# Patient Record
Sex: Male | Born: 1955 | Race: White | Hispanic: No | Marital: Married | Smoking: Never smoker
Health system: Southern US, Community
[De-identification: ages and names within clinical notes are randomized; demographics above are authoritative.]

## PROBLEM LIST (undated history)

## (undated) ENCOUNTER — Emergency Department (HOSPITAL_COMMUNITY): Discharge status: Absent without leave | Payer: BC Managed Care – PPO

## (undated) DIAGNOSIS — F419 Anxiety disorder, unspecified: Secondary | ICD-10-CM

## (undated) HISTORY — PX: APPENDECTOMY: SHX54

## (undated) HISTORY — DX: Anxiety disorder, unspecified: F41.9

## (undated) HISTORY — PX: CHOLECYSTECTOMY: SHX55

---

## 2008-09-18 ENCOUNTER — Ambulatory Visit (HOSPITAL_COMMUNITY): Admission: RE | Admit: 2008-09-18 | Discharge: 2008-09-18 | Payer: Self-pay | Admitting: Family Medicine

## 2009-01-12 ENCOUNTER — Ambulatory Visit (HOSPITAL_COMMUNITY): Admission: RE | Admit: 2009-01-12 | Discharge: 2009-01-12 | Payer: Self-pay | Admitting: Family Medicine

## 2010-05-31 ENCOUNTER — Ambulatory Visit (HOSPITAL_COMMUNITY): Admission: RE | Admit: 2010-05-31 | Discharge: 2010-05-31 | Payer: Self-pay | Admitting: Family Medicine

## 2010-11-20 ENCOUNTER — Encounter: Payer: Self-pay | Admitting: Family Medicine

## 2011-07-10 ENCOUNTER — Ambulatory Visit: Payer: Self-pay | Admitting: Gastroenterology

## 2012-02-09 ENCOUNTER — Ambulatory Visit (HOSPITAL_COMMUNITY)
Admission: RE | Admit: 2012-02-09 | Discharge: 2012-02-09 | Disposition: A | Discharge status: Home / Self Care | Payer: BC Managed Care – PPO | Source: Ambulatory Visit | Attending: Family Medicine | Admitting: Family Medicine

## 2012-02-09 ENCOUNTER — Other Ambulatory Visit (HOSPITAL_COMMUNITY): Payer: Self-pay | Admitting: Family Medicine

## 2012-02-09 DIAGNOSIS — R0989 Other specified symptoms and signs involving the circulatory and respiratory systems: Secondary | ICD-10-CM | POA: Insufficient documentation

## 2012-02-09 DIAGNOSIS — R059 Cough, unspecified: Secondary | ICD-10-CM | POA: Insufficient documentation

## 2012-02-09 DIAGNOSIS — R05 Cough: Secondary | ICD-10-CM

## 2012-02-19 ENCOUNTER — Telehealth: Payer: Self-pay

## 2012-02-19 NOTE — Telephone Encounter (Signed)
Called home and work numbers. Both got many rings and no answer.

## 2012-03-12 NOTE — Telephone Encounter (Signed)
LMOM at home number to call. LM at work to call.

## 2012-03-22 NOTE — Telephone Encounter (Signed)
Called, many rings and no answer. Will mail letter for pt to call. Letter to PCP also.

## 2012-11-02 IMAGING — CR DG CHEST 2V
2 series · 2 of 2 positions shown · non-contrast
Comparison: 05/31/2010.

CLINICAL DATA: 55-year-old male with nonproductive cough.
Congestion.

CHEST - 2 VIEW

[view not recorded (1 of 2)]
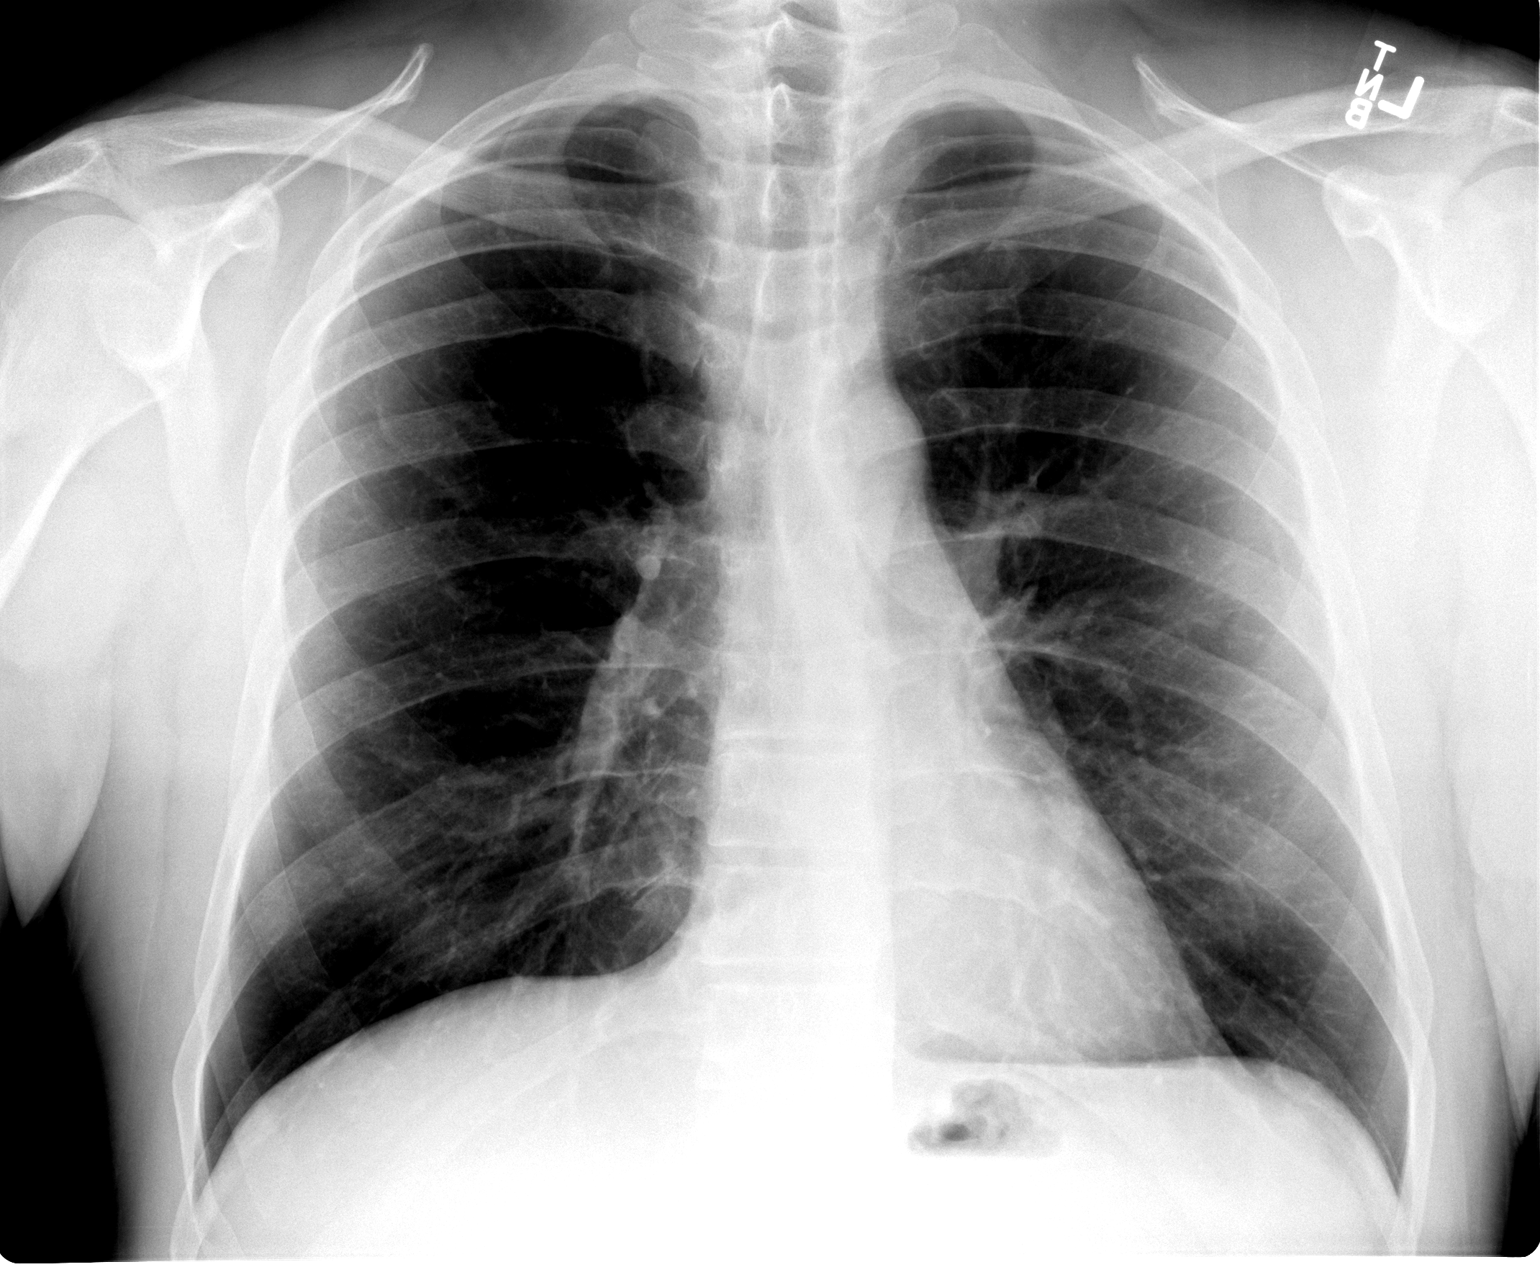

[view not recorded (2 of 2)]
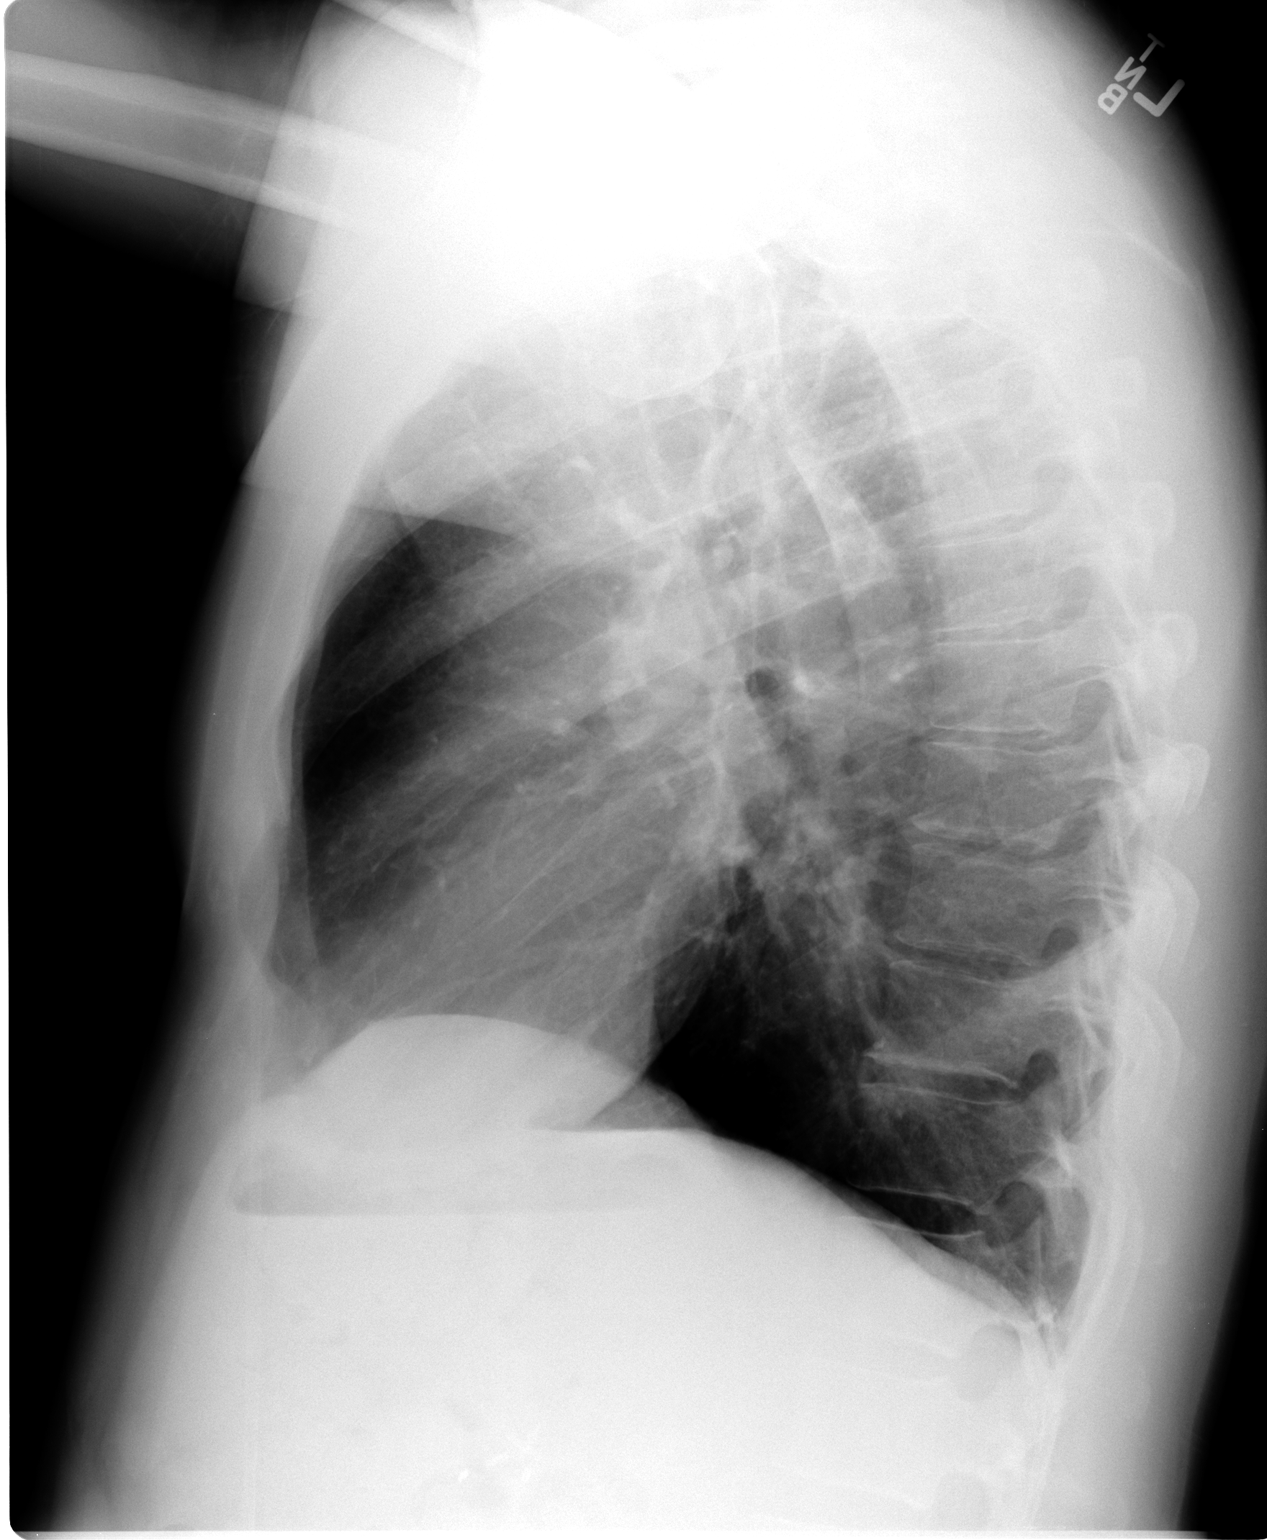

[2 of 2 positions shown; findings below may reference images not displayed]

FINDINGS: Normal cardiac size and mediastinal contours.  Visualized
tracheal air column is within normal limits.  Lung volumes are
within normal limits.  The lungs are clear.  No pneumothorax or
effusion.  Abdominal surgical clips. No acute osseous abnormality
identified.
IMPRESSION: Negative, no acute cardiopulmonary abnormality.

## 2016-07-24 ENCOUNTER — Ambulatory Visit (INDEPENDENT_AMBULATORY_CARE_PROVIDER_SITE_OTHER): Payer: BC Managed Care – PPO

## 2016-07-24 ENCOUNTER — Ambulatory Visit: Payer: BC Managed Care – PPO

## 2016-07-24 ENCOUNTER — Ambulatory Visit (INDEPENDENT_AMBULATORY_CARE_PROVIDER_SITE_OTHER): Payer: BC Managed Care – PPO | Admitting: Orthopedic Surgery

## 2016-07-24 VITALS — BP 116/82 | HR 78 | Ht 72.0 in | Wt 204.0 lb

## 2016-07-24 DIAGNOSIS — M25521 Pain in right elbow: Secondary | ICD-10-CM

## 2016-07-24 DIAGNOSIS — M7712 Lateral epicondylitis, left elbow: Secondary | ICD-10-CM | POA: Diagnosis not present

## 2016-07-24 DIAGNOSIS — M25522 Pain in left elbow: Secondary | ICD-10-CM

## 2016-07-24 NOTE — Patient Instructions (Signed)

## 2016-07-24 NOTE — Progress Notes (Signed)
Chief Complaint  Patient presents with  . Joint Swelling    Bilateral elbow pain, no injury.   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 He was treated for cough was elbow appropriately with ice, exercises, to anti-inflammatories. His medial pain in both elbows now mildly symptomatic but the lateral elbow pain has worsened and is causing him severe disability. He thinks this may have started after he stained is that  Review of Systems  Constitutional: Negative for chills, fever and weight loss.  Respiratory: Negative for shortness of breath.   Cardiovascular: Negative for chest pain.  Neurological: Negative for tingling.    No past medical history on file.  No past surgical history on file. No family history on file. Social History  Substance Use Topics  . Smoking status: Not on file  . Smokeless tobacco: Not on file  . Alcohol use Not on file   No outpatient prescriptions have been marked as taking for the 07/24/16 encounter (Office Visit) with Vickki HearingStanley E Bergen Melle, MD.    BP 116/82   Pulse 78   Ht 6' (1.829 m)   Wt 204 lb (92.5 kg)   BMI 27.67 kg/m   Physical Exam  Constitutional: He is oriented to person, place, and time. He appears well-developed and well-nourished. No distress.  Cardiovascular: Normal rate and intact distal pulses.   Neurological: He is alert and oriented to person, place, and time.  Skin: Skin is warm and dry. No rash noted. He is not diaphoretic. No erythema. No pallor.  Psychiatric: He has a normal mood and affect. His behavior is normal. Judgment and thought content normal.    Ortho Exam Right elbow medial tenderness mild full range of motion no instability normal strength negative wrist resistance test skin normal normal pulses and no epitrochlear lymph nodes are palpable  Left elbow mild medial tenderness more severe lateral  tenderness positive wrist extension test grip strength normal elbow stable to varus valgus stress skin normal normal pulses no lymphadenopathy in neck or epitrochlear region no sensory abnormalities  ASSESSMENT: My personal interpretation of the images:  X-rays both elbows normal  Encounter Diagnoses  Name Primary?  . Elbow pain, left Yes  . Elbow pain, right   . Tennis elbow, left        PLAN Inject left elbow for tennis elbow  Follow-up if no improvement I gave him an option of surgery versus injection.  Procedure note injection for left tennis elbow  Diagnosis left tennis elbow  Anesthesia ethyl chloride was used Alcohol use is clean the skin  After we obtained verbal consent and timeout a 25-gauge needle was used to inject 40 mg of Depo-Medrol and 3 cc of 1% lidocaine just distal to the insertion of the ECRB  There were no complications and a sterile bandage was applied.   Fuller CanadaStanley Vestal Markin, MD 07/24/2016 4:25 PM  .meds

## 2021-10-20 ENCOUNTER — Encounter: Payer: Self-pay | Admitting: *Deleted

## 2021-11-29 ENCOUNTER — Ambulatory Visit (INDEPENDENT_AMBULATORY_CARE_PROVIDER_SITE_OTHER): Payer: BC Managed Care – PPO | Admitting: Urology

## 2021-11-29 ENCOUNTER — Encounter: Payer: Self-pay | Admitting: Urology

## 2021-11-29 ENCOUNTER — Other Ambulatory Visit: Payer: Self-pay

## 2021-11-29 VITALS — BP 128/80 | HR 59 | Wt 205.0 lb

## 2021-11-29 DIAGNOSIS — R972 Elevated prostate specific antigen [PSA]: Secondary | ICD-10-CM

## 2021-11-29 DIAGNOSIS — N400 Enlarged prostate without lower urinary tract symptoms: Secondary | ICD-10-CM

## 2021-11-29 NOTE — Progress Notes (Addendum)
H&P  Chief Complaint: Elevated PSA  History of Present Illness: 66 year old male referred by [facility] for elevated PSA.  The only data I have is a PSA drawn on 05/31/2021 and was 7.9. PSA on 12/25/2019 was 4.6.  Patient says that he underwent an ultrasound and biopsy of the prostate in [place], [place] within the past 10 years.  He also had what sounds like a negative hematuria evaluation for microscopic hematuria.  IPSS 5  No past medical history on file.    Home Medications:  Allergies as of 11/29/2021   No Known Allergies      Medication List    as of November 29, 2021  8:06 AM   You have not been prescribed any medications.     Allergies: No Known Allergies  No family history on file.  Social History:  has no history on file for tobacco use, alcohol use, and drug use.  ROS: A complete review of systems was performed.  All systems are negative except for pertinent findings as noted.  Physical Exam:  Vital signs in last 24 hours: There were no vitals taken for this visit. Constitutional:  Alert and oriented, No acute distress Cardiovascular: Regular rate  Respiratory: Normal respiratory effort GI: Abdomen is soft, nontender, nondistended, no abdominal masses. No CVAT.  No inguinal hernias. Genitourinary: Normal male phallus, testes are descended bilaterally and non-tender and without masses, scrotum is normal in appearance without lesions or masses, perineum is normal on inspection.  Prostate is 70 g in size, symmetric, nonnodular, nontender. Lymphatic: No lymphadenopathy Neurologic: Grossly intact, no focal deficits Psychiatric: Normal mood and affect  I have reviewed prior pt notes  I have reviewed notes from referring/previous physicians  I have independently reviewed prior imaging  I have reviewed prior PSA results  I have requested old records from [place] reviewed   Impression/Assessment:  1.  BPH without  significant symptoms-prostate glandular size approximately 70 on estimated DRE  2.  Elevated PSA.  Questionable history of ultrasound and biopsy in [facility].  His PSA went from 4.6 in 21-7.9 in 22  Plan:  1.  I will see if we can get records from [facility].  2.  I will repeat his PSA today  3.  If negative biopsy previously and persistent elevation of PSA, I will proceed with MRI of the prostate

## 2021-11-29 NOTE — Progress Notes (Signed)
Urological Symptom Review  Patient is experiencing the following symptoms: Get up at night to urinate Trouble starting stream Have to strain to urinate   Review of Systems  Gastrointestinal (upper)  : Negative for upper GI symptoms  Gastrointestinal (lower) : Negative for lower GI symptoms  Constitutional : Negative for symptoms  Skin: Negative for skin symptoms  Eyes: Negative for eye symptoms  Ear/Nose/Throat : Negative for Ear/Nose/Throat symptoms  Hematologic/Lymphatic: Negative for Hematologic/Lymphatic symptoms  Cardiovascular : Negative for cardiovascular symptoms  Respiratory : Negative for respiratory symptoms  Endocrine: Negative for endocrine symptoms  Musculoskeletal: Negative for musculoskeletal symptoms  Neurological: Negative for neurological symptoms  Psychologic: Negative for psychiatric symptoms 

## 2021-11-30 LAB — PSA: Prostate Specific Ag, Serum: 5.2 ng/mL — ABNORMAL HIGH (ref 0.0–4.0)

## 2021-12-13 ENCOUNTER — Telehealth: Payer: Self-pay

## 2021-12-13 NOTE — Telephone Encounter (Signed)
-----   Message from Marcine Matar, MD sent at 12/13/2021  9:55 AM EST ----- Let patient know his PSA was 5.2.  Still working on records from prior biopsy. ----- Message ----- From: Ferdinand Lango, RN Sent: 12/08/2021   5:31 PM EST To: Marcine Matar, MD  I see records in media from 2/1 from ED.  Are those the records?  ----- Message ----- From: Marcine Matar, MD Sent: 12/02/2021  12:01 PM EST To: Ferdinand Lango, RN  Notify patient that PSA was 5.2 on Tuesday, lower than in the summer 2022.  We are still waiting for his other records from the ED in office to come in.  If he has not heard from [place] in about another 10 days asked him to call.  Otherwise, once I receive old records and review those, we will be back with him about planning follow-up. ----- Message ----- From: Ferdinand Lango, RN Sent: 11/30/2021   8:38 AM EST To: Marcine Matar, MD  Please review

## 2021-12-13 NOTE — Telephone Encounter (Signed)
I called patient, no answer mailbox full  Message sent to [facility] to check into records.

## 2021-12-13 NOTE — Telephone Encounter (Signed)
Patient called and notified. Voiced understanding.  

## 2022-11-23 ENCOUNTER — Encounter (INDEPENDENT_AMBULATORY_CARE_PROVIDER_SITE_OTHER): Payer: Self-pay | Admitting: *Deleted

## 2023-01-09 ENCOUNTER — Telehealth (INDEPENDENT_AMBULATORY_CARE_PROVIDER_SITE_OTHER): Payer: Self-pay | Admitting: Gastroenterology

## 2023-01-09 NOTE — Telephone Encounter (Signed)
Any room Thanks 

## 2023-01-09 NOTE — Telephone Encounter (Signed)
Who is your primary care physician: Dr.Fusco  Reasons for the colonoscopy: Screening  Have you had a colonoscopy before?  No  Do you have family history of colon cancer? No  Previous colonoscopy with polyps removed? No  Do you have a history colorectal cancer?   No  Are you diabetic? If yes, Type 1 or Type 2?    No  Do you have a prosthetic or mechanical heart valve? No  Do you have a pacemaker/defibrillator?   No  Have you had endocarditis/atrial fibrillation? No  Have you had joint replacement within the last 12 months?  No  Do you tend to be constipated or have to use laxatives? No  Do you have any history of drugs or alchohol?  No  Do you use supplemental oxygen?  No  Have you had a stroke or heart attack within the last 6 months?No  Do you take weight loss medication? No  Do you take any blood-thinning medications such as: (aspirin, warfarin, Plavix, Aggrenox)  No  If yes we need the name, milligram, dosage and who is prescribing doctor  Current Outpatient Medications on File Prior to Visit  Medication Sig Dispense Refill   diazepam (VALIUM) 10 MG tablet Take 10 mg by mouth 2 (two) times daily as needed.     No current facility-administered medications on file prior to visit.    No Known Allergies   Pharmacy: [facility]  Primary Insurance Name: [employer] number where you can be reached: [phone]

## 2023-01-11 MED ORDER — PEG 3350-KCL-NA BICARB-NACL 420 G PO SOLR: 4000.0000 mL | Freq: Once | ORAL | 0 refills | Status: AC

## 2023-01-11 NOTE — Addendum Note (Signed)
Addended by: Vicente Males on: 01/11/2023 11:39 AM   Modules accepted: Orders

## 2023-01-11 NOTE — Telephone Encounter (Signed)
Pt contacted and scheduled colonoscopy for 02/08/23. Instructions will be mailed to patient. Prep sent to pharmacy.

## 2023-01-15 ENCOUNTER — Encounter (INDEPENDENT_AMBULATORY_CARE_PROVIDER_SITE_OTHER): Payer: Self-pay | Admitting: *Deleted

## 2023-01-15 NOTE — Telephone Encounter (Signed)
Referral completed

## 2023-02-08 ENCOUNTER — Other Ambulatory Visit: Payer: Self-pay

## 2023-02-08 ENCOUNTER — Encounter (HOSPITAL_COMMUNITY): Payer: Self-pay | Admitting: Gastroenterology

## 2023-02-08 ENCOUNTER — Encounter (HOSPITAL_COMMUNITY)
Admission: RE | Disposition: A | Discharge status: Home / Self Care | Payer: Self-pay | Source: Ambulatory Visit | Attending: Gastroenterology

## 2023-02-08 ENCOUNTER — Encounter (INDEPENDENT_AMBULATORY_CARE_PROVIDER_SITE_OTHER): Payer: Self-pay | Admitting: *Deleted

## 2023-02-08 ENCOUNTER — Ambulatory Visit (HOSPITAL_COMMUNITY): Payer: BC Managed Care – PPO | Admitting: Anesthesiology

## 2023-02-08 ENCOUNTER — Ambulatory Visit (HOSPITAL_COMMUNITY)
Admission: RE | Admit: 2023-02-08 | Discharge: 2023-02-08 | Disposition: A | Discharge status: Home / Self Care | Payer: BC Managed Care – PPO | Source: Ambulatory Visit | Attending: Gastroenterology | Admitting: Gastroenterology

## 2023-02-08 DIAGNOSIS — Z1211 Encounter for screening for malignant neoplasm of colon: Secondary | ICD-10-CM | POA: Diagnosis not present

## 2023-02-08 DIAGNOSIS — F419 Anxiety disorder, unspecified: Secondary | ICD-10-CM | POA: Insufficient documentation

## 2023-02-08 DIAGNOSIS — K648 Other hemorrhoids: Secondary | ICD-10-CM

## 2023-02-08 DIAGNOSIS — D122 Benign neoplasm of ascending colon: Secondary | ICD-10-CM | POA: Diagnosis not present

## 2023-02-08 HISTORY — PX: COLONOSCOPY WITH PROPOFOL: SHX5780

## 2023-02-08 HISTORY — PX: POLYPECTOMY: SHX149

## 2023-02-08 LAB — HM COLONOSCOPY

## 2023-02-08 SURGERY — COLONOSCOPY WITH PROPOFOL
Anesthesia: General

## 2023-02-08 MED ORDER — LIDOCAINE 2% (20 MG/ML) 5 ML SYRINGE
INTRAMUSCULAR | Status: DC | PRN
  Administered 2023-02-08: 60 mg via INTRAVENOUS

## 2023-02-08 MED ORDER — LACTATED RINGERS IV SOLN: INTRAVENOUS | Status: DC

## 2023-02-08 MED ORDER — PROPOFOL 500 MG/50ML IV EMUL
INTRAVENOUS | Status: AC
  Filled 2023-02-08: qty 50

## 2023-02-08 MED ORDER — PROPOFOL 10 MG/ML IV BOLUS
INTRAVENOUS | Status: DC | PRN
  Administered 2023-02-08 (×11): 50 mg via INTRAVENOUS

## 2023-02-08 MED ORDER — LIDOCAINE HCL (PF) 2 % IJ SOLN
INTRAMUSCULAR | Status: AC
  Filled 2023-02-08: qty 5

## 2023-02-08 MED ORDER — STERILE WATER FOR IRRIGATION IR SOLN
Status: DC | PRN
  Administered 2023-02-08: 60 mL

## 2023-02-08 MED ORDER — PHENYLEPHRINE 80 MCG/ML (10ML) SYRINGE FOR IV PUSH (FOR BLOOD PRESSURE SUPPORT)
PREFILLED_SYRINGE | INTRAVENOUS | Status: AC
  Filled 2023-02-08: qty 10

## 2023-02-08 NOTE — Anesthesia Procedure Notes (Signed)
Procedure Name: MAC Date/Time: 02/08/2023 10:12 AM  Performed by: Adria Dill, CRNAPre-anesthesia Checklist: Patient identified, Emergency Drugs available, Suction available and Patient being monitored Patient Re-evaluated:Patient Re-evaluated prior to induction Oxygen Delivery Method: Nasal cannula Preoxygenation: Pre-oxygenation with 100% oxygen Induction Type: IV induction Placement Confirmation: positive ETCO2 and breath sounds checked- equal and bilateral Dental Injury: Teeth and Oropharynx as per pre-operative assessment

## 2023-02-08 NOTE — Transfer of Care (Signed)
Immediate Anesthesia Transfer of Care Note  Patient: Charles Moss  Procedure(s) Performed: COLONOSCOPY WITH PROPOFOL POLYPECTOMY INTESTINAL  Patient Location: PACU  Anesthesia Type:MAC  Level of Consciousness: drowsy and patient cooperative  Airway & Oxygen Therapy: Patient Spontanous Breathing  Post-op Assessment: Report given to RN and Post -op Vital signs reviewed and stable  Post vital signs: Reviewed and stable  Last Vitals:  Vitals Value Taken Time  BP 94/61 02/08/23 1034  Temp 36.4 C 02/08/23 1034  Pulse 68 02/08/23 1034  Resp 17 02/08/23 1034  SpO2 96 % 02/08/23 1034    Last Pain:  Vitals:   02/08/23 1034  TempSrc: Axillary  PainSc: 0-No pain      Patients Stated Pain Goal: 10 (02/08/23 0834)  Complications: No notable events documented.

## 2023-02-08 NOTE — Discharge Instructions (Signed)
You are being discharged to home.  Resume your previous diet.  We are waiting for your pathology results.  Your physician has recommended a repeat colonoscopy for surveillance based on pathology results.  

## 2023-02-08 NOTE — Op Note (Signed)
[facility] Patient Name: Charles Moss Procedure Date: 02/08/2023 9:59 AM MRN: [id number] Date of Birth: [birth year] Attending MD: Katrinka Blazing , , [id number] CSN: [id number] Age: 67 Admit Type: Outpatient Procedure:                Colonoscopy Indications:              Screening for colorectal malignant neoplasm Providers:                Katrinka Blazing, Crystal Page, Kristine L. Jessee Avers, Technician Referring MD:              Medicines:                Monitored Anesthesia Care Complications:            No immediate complications. Estimated Blood Loss:     Estimated blood loss: none. Procedure:                Pre-Anesthesia Assessment:                           - Prior to the procedure, a History and Physical                            was performed, and patient medications, allergies                            and sensitivities were reviewed. The patient's                            tolerance of previous anesthesia was reviewed.                           - The risks and benefits of the procedure and the                            sedation options and risks were discussed with the                            patient. All questions were answered and informed                            consent was obtained.                           - ASA Grade Assessment: II - A patient with mild                            systemic disease.                           After obtaining informed consent, the colonoscope                            was passed under direct vision. Throughout the  procedure, the patient's blood pressure, pulse, and                            oxygen saturations were monitored continuously. The                            PCF-HQ190L ([id number]) scope was introduced through                            the anus and advanced to the the cecum, identified                            by appendiceal orifice and ileocecal valve. The                             colonoscopy was performed without difficulty. The                            patient tolerated the procedure well. The quality                            of the bowel preparation was good. Scope In: 10:11:55 AM Scope Out: 10:31:29 AM Scope Withdrawal Time: 0 hours 13 minutes 17 seconds  Total Procedure Duration: 0 hours 19 minutes 34 seconds  Findings:      The perianal and digital rectal examinations were normal.      A 4 mm polyp was found in the ascending colon. The polyp was flat. The       polyp was removed with a cold snare. Resection and retrieval were       complete.      Non-bleeding internal hemorrhoids were found during retroflexion. The       hemorrhoids were small. Impression:               - One 4 mm polyp in the ascending colon, removed                            with a cold snare. Resected and retrieved.                           - Non-bleeding internal hemorrhoids. Moderate Sedation:      Per Anesthesia Care Recommendation:           - Discharge patient to home (ambulatory).                           - Resume previous diet.                           - Await pathology results.                           - Repeat colonoscopy for surveillance based on                            pathology results. Procedure Code(s):        ---  Professional ---                           [id number], Colonoscopy, flexible; with removal of                            tumor(s), polyp(s), or other lesion(s) by snare                            technique Diagnosis Code(s):        --- Professional ---                           Z12.11, Encounter for screening for malignant                            neoplasm of colon                           D12.2, Benign neoplasm of ascending colon                           K64.8, Other hemorrhoids CPT copyright 2022 American Medical Association. All rights reserved. The codes documented in this report are preliminary and upon coder review  may  be revised to meet current compliance requirements. Katrinka Blazinganiel Castaneda, MD Katrinka Blazinganiel Castaneda,  02/08/2023 10:35:10 AM This report has been signed electronically. Number of Addenda: 0

## 2023-02-08 NOTE — Anesthesia Preprocedure Evaluation (Signed)
Anesthesia Evaluation  Patient identified by MRN, date of birth, ID band Patient awake    Reviewed: Allergy & Precautions, H&P , NPO status , Patient's Chart, lab work & pertinent test results, reviewed documented beta blocker date and time   Airway Mallampati: II  TM Distance: >3 FB Neck ROM: full    Dental no notable dental hx.    Pulmonary neg pulmonary ROS   Pulmonary exam normal breath sounds clear to auscultation       Cardiovascular Exercise Tolerance: Good negative cardio ROS  Rhythm:regular Rate:Normal     Neuro/Psych   Anxiety     negative neurological ROS  negative psych ROS   GI/Hepatic negative GI ROS, Neg liver ROS,,,  Endo/Other  negative endocrine ROS    Renal/GU negative Renal ROS  negative genitourinary   Musculoskeletal   Abdominal   Peds  Hematology negative hematology ROS (+)   Anesthesia Other Findings   Reproductive/Obstetrics negative OB ROS                             Anesthesia Physical Anesthesia Plan  ASA: 1  Anesthesia Plan: General   Post-op Pain Management:    Induction:   PONV Risk Score and Plan: Propofol infusion  Airway Management Planned:   Additional Equipment:   Intra-op Plan:   Post-operative Plan:   Informed Consent: I have reviewed the patients History and Physical, chart, labs and discussed the procedure including the risks, benefits and alternatives for the proposed anesthesia with the patient or authorized representative who has indicated his/her understanding and acceptance.     Dental Advisory Given  Plan Discussed with: CRNA  Anesthesia Plan Comments:        Anesthesia Quick Evaluation

## 2023-02-08 NOTE — H&P (Signed)
Charles Moss is an 67 y.o. male.   Chief Complaint: Colorectal cancer screening HPI: 67 year old male with past medical history of anxiety, coming for screening colonoscopy. The patient has never had a colonoscopy in the past.  The patient denies having any complaints such as melena, hematochezia, abdominal pain or distention, change in her bowel movement consistency or frequency, no changes in weight recently.  No family history of colorectal cancer.   Past Medical History:  Diagnosis Date   Anxiety     Past Surgical History:  Procedure Laterality Date   APPENDECTOMY     CHOLECYSTECTOMY      History reviewed. No pertinent family history. Social History:  reports that he has never smoked. He has never used smokeless tobacco. He reports current alcohol use of about 1.0 standard drink of alcohol per week. He reports that he does not use drugs.  Allergies: No Known Allergies  Medications Prior to Admission  Medication Sig Dispense Refill   diazepam (VALIUM) 10 MG tablet Take 10 mg by mouth 2 (two) times daily as needed.      No results found for this or any previous visit (from the past 48 hour(s)). No results found.  Review of Systems  All other systems reviewed and are negative.   Blood pressure 132/82, pulse 68, temperature 97.9 F (36.6 C), temperature source Oral, resp. rate 14, height 6' (1.829 m), weight 95.3 kg, SpO2 96 %. Physical Exam  GENERAL: The patient is AO x3, in no acute distress. HEENT: Head is normocephalic and atraumatic. EOMI are intact. Mouth is well hydrated and without lesions. NECK: Supple. No masses LUNGS: Clear to auscultation. No presence of rhonchi/wheezing/rales. Adequate chest expansion HEART: RRR, normal s1 and s2. ABDOMEN: Soft, nontender, no guarding, no peritoneal signs, and nondistended. BS +. No masses. EXTREMITIES: Without any cyanosis, clubbing, rash, lesions or edema. NEUROLOGIC: AOx3, no focal motor deficit. SKIN: no jaundice, no  rashes  Assessment/Plan 67 year old male with past medical history of anxiety, coming for screening colonoscopy. The patient is at average risk for colorectal cancer.  We will proceed with colonoscopy today.   Dolores Frame, MD 02/08/2023, 8:51 AM

## 2023-02-09 LAB — SURGICAL PATHOLOGY

## 2023-02-09 NOTE — Anesthesia Postprocedure Evaluation (Signed)
Anesthesia Post Note  Patient: Charles Moss  Procedure(s) Performed: COLONOSCOPY WITH PROPOFOL POLYPECTOMY INTESTINAL  Patient location during evaluation: Phase II Anesthesia Type: General Level of consciousness: awake Pain management: pain level controlled Vital Signs Assessment: post-procedure vital signs reviewed and stable Respiratory status: spontaneous breathing and respiratory function stable Cardiovascular status: blood pressure returned to baseline and stable Postop Assessment: no headache and no apparent nausea or vomiting Anesthetic complications: no Comments: Late entry   No notable events documented.   Last Vitals:  Vitals:   02/08/23 0834 02/08/23 1034  BP: 132/82 94/61  Pulse: 68 68  Resp: 14 17  Temp: 36.6 C (!) 36.4 C  SpO2: 96% 96%    Last Pain:  Vitals:   02/08/23 1039  TempSrc:   PainSc: 0-No pain                 Windell Norfolk

## 2023-02-13 ENCOUNTER — Encounter (INDEPENDENT_AMBULATORY_CARE_PROVIDER_SITE_OTHER): Payer: Self-pay | Admitting: *Deleted

## 2023-02-15 ENCOUNTER — Encounter (HOSPITAL_COMMUNITY): Payer: Self-pay | Admitting: Gastroenterology

## 2023-06-25 NOTE — Progress Notes (Signed)
History of Present Illness: Charles Moss returns for further f/u of elevated PSA. Initial visit 1.31.2023 at which time his PSA in this office was 5.2. Earlier PSA levels--  2.25.2021--4.6 8.22.2022--7.9  Past Medical History:  Diagnosis Date   Anxiety     Past Surgical History:  Procedure Laterality Date   APPENDECTOMY     CHOLECYSTECTOMY     COLONOSCOPY WITH PROPOFOL N/A 02/08/2023   Procedure: COLONOSCOPY WITH PROPOFOL;  Surgeon: Dolores Frame, MD;  Location: [facility];  Service: Gastroenterology;  Laterality: N/A;  10:30am;asa 1-2   POLYPECTOMY  02/08/2023   Procedure: POLYPECTOMY INTESTINAL;  Surgeon: Dolores Frame, MD;  Location: [facility];  Service: Gastroenterology;;    Home Medications:  Allergies as of 06/26/2023   No Known Allergies      Medication List        Accurate as of June 25, 2023  2:45 PM. If you have any questions, ask your nurse or doctor.          diazepam 10 MG tablet Commonly known as: VALIUM Take 10 mg by mouth 2 (two) times daily as needed.        Allergies: No Known Allergies  No family history on file.  Social History:  reports that he has never smoked. He has never used smokeless tobacco. He reports current alcohol use of about 1.0 standard drink of alcohol per week. He reports that he does not use drugs.  ROS: A complete review of systems was performed.  All systems are negative except for pertinent findings as noted.  Physical Exam:  Vital signs in last 24 hours: There were no vitals taken for this visit. Constitutional:  Alert and oriented, No acute distress Cardiovascular: Regular rate  Respiratory: Normal respiratory effort GI: Abdomen is soft, nontender, nondistended, no abdominal masses. No CVAT.  Genitourinary: Normal male phallus, testes are descended bilaterally and non-tender and without masses, scrotum is normal in appearance without lesions or masses, perineum is normal on  inspection. Lymphatic: No lymphadenopathy Neurologic: Grossly intact, no focal deficits Psychiatric: Normal mood and affect  I have reviewed prior pt notes  I have reviewed notes from referring/previous physicians  I have reviewed urinalysis results  I have independently reviewed prior imaging  I have reviewed prior PSA results  I have reviewed prior urine culture   Impression/Assessment:  ***  Plan:  ***

## 2023-06-26 ENCOUNTER — Other Ambulatory Visit: Payer: Self-pay | Admitting: Urology

## 2023-06-26 ENCOUNTER — Encounter: Payer: Self-pay | Admitting: Urology

## 2023-06-26 ENCOUNTER — Ambulatory Visit: Payer: BC Managed Care – PPO | Admitting: Urology

## 2023-06-26 ENCOUNTER — Telehealth: Payer: Self-pay | Admitting: Urology

## 2023-06-26 VITALS — BP 107/71 | HR 76

## 2023-06-26 DIAGNOSIS — R339 Retention of urine, unspecified: Secondary | ICD-10-CM

## 2023-06-26 DIAGNOSIS — R972 Elevated prostate specific antigen [PSA]: Secondary | ICD-10-CM

## 2023-06-26 DIAGNOSIS — N401 Enlarged prostate with lower urinary tract symptoms: Secondary | ICD-10-CM

## 2023-06-26 DIAGNOSIS — N400 Enlarged prostate without lower urinary tract symptoms: Secondary | ICD-10-CM

## 2023-06-26 MED ORDER — ALFUZOSIN HCL ER 10 MG PO TB24
10.0000 mg | ORAL_TABLET | Freq: Every day | ORAL | 11 refills | Status: AC
Start: 2023-06-26 — End: ?

## 2023-06-26 NOTE — Telephone Encounter (Signed)
Patient would like medication sent to [facility] in [place] please

## 2023-06-26 NOTE — Telephone Encounter (Signed)
Patient returned call to correct pharmacy for [facility] in [place]> Chart updated

## 2023-07-02 NOTE — Progress Notes (Signed)
History of Present Illness: Here for f/u of BPH w/ AUR as well as elevated PSA.  Seen last week for AUR--started on alfuzosin when seen here in the office.  He has tolerated the alfuzosin well.    Past Medical History:  Diagnosis Date   Anxiety     Past Surgical History:  Procedure Laterality Date   APPENDECTOMY     CHOLECYSTECTOMY     COLONOSCOPY WITH PROPOFOL N/A 02/08/2023   Procedure: COLONOSCOPY WITH PROPOFOL;  Surgeon: Dolores Frame, MD;  Location: [facility];  Service: Gastroenterology;  Laterality: N/A;  10:30am;asa 1-2   POLYPECTOMY  02/08/2023   Procedure: POLYPECTOMY INTESTINAL;  Surgeon: Dolores Frame, MD;  Location: [facility];  Service: Gastroenterology;;    Home Medications:  Allergies as of 07/03/2023   No Known Allergies      Medication List        Accurate as of July 02, 2023  4:01 PM. If you have any questions, ask your nurse or doctor.          alfuzosin 10 MG 24 hr tablet Commonly known as: UROXATRAL Take 1 tablet (10 mg total) by mouth daily with breakfast.   diazepam 10 MG tablet Commonly known as: VALIUM Take 10 mg by mouth 2 (two) times daily as needed.        Allergies: No Known Allergies  No family history on file.  Social History:  reports that he has never smoked. He has never used smokeless tobacco. He reports current alcohol use of about 1.0 standard drink of alcohol per week. He reports that he does not use drugs.  ROS: A complete review of systems was performed.  All systems are negative except for pertinent findings as noted.  Physical Exam:  Vital signs in last 24 hours: There were no vitals taken for this visit. Constitutional:  Alert and oriented, No acute distress Cardiovascular: Regular rate  Respiratory: Normal respiratory effort Neurologic: Grossly intact, no focal deficits Psychiatric: Normal mood and affect  I have reviewed prior pt notes  I have reviewed urinalysis  results  I have independently reviewed prior imaging  I have reviewed prior PSA results    Impression/Assessment:  1.  History of elevated PSA, no biopsy performed yet.  Most likely secondary to BPH  2.  BPH with retention, passed voiding trial today.  Plan:  1.  Continue alfuzosin.  2.  Cephalexin sent for coverage of his catheterization  3.  I will have him come back in a few weeks following PSA for recheck

## 2023-07-03 ENCOUNTER — Ambulatory Visit: Payer: Medicare PPO | Admitting: Urology

## 2023-07-03 ENCOUNTER — Encounter: Payer: Self-pay | Admitting: Urology

## 2023-07-03 VITALS — BP 112/73 | HR 83

## 2023-07-03 DIAGNOSIS — R339 Retention of urine, unspecified: Secondary | ICD-10-CM | POA: Diagnosis not present

## 2023-07-03 DIAGNOSIS — N401 Enlarged prostate with lower urinary tract symptoms: Secondary | ICD-10-CM | POA: Diagnosis not present

## 2023-07-03 DIAGNOSIS — R972 Elevated prostate specific antigen [PSA]: Secondary | ICD-10-CM

## 2023-07-03 DIAGNOSIS — N400 Enlarged prostate without lower urinary tract symptoms: Secondary | ICD-10-CM

## 2023-07-03 MED ORDER — CEPHALEXIN 500 MG PO CAPS
500.0000 mg | ORAL_CAPSULE | Freq: Two times a day (BID) | ORAL | 0 refills | Status: AC
Start: 2023-07-03 — End: 2023-07-06

## 2023-07-03 NOTE — Progress Notes (Signed)
Fill and Pull Catheter Removal  Patient is present today for a catheter removal.  Patient was cleaned and prepped in a sterile fashion of sterile water/ saline was instilled into the bladder when the patient felt the urge to urinate. 10ml of water was then drained from the balloon.  A 16FR foley cath was removed from the bladder no complications were noted .  Patient as then given some time to void on their own.  Patient can void  250 ml on their own after some time.  Patient tolerated well.  Performed by: Guss Bunde, CMA  Follow up/ Additional notes: MD to see after

## 2023-07-10 MED ORDER — ALFUZOSIN HCL ER 10 MG PO TB24
10.0000 mg | ORAL_TABLET | Freq: Every day | ORAL | 11 refills | Status: DC
Start: 2023-07-10 — End: 2024-07-02

## 2023-08-14 ENCOUNTER — Other Ambulatory Visit: Payer: Medicare PPO

## 2023-08-14 DIAGNOSIS — R972 Elevated prostate specific antigen [PSA]: Secondary | ICD-10-CM

## 2023-08-15 LAB — PSA: Prostate Specific Ag, Serum: 8.7 ng/mL — ABNORMAL HIGH (ref 0.0–4.0)

## 2023-08-20 ENCOUNTER — Ambulatory Visit: Payer: Medicare PPO | Admitting: Urology

## 2023-08-20 ENCOUNTER — Encounter: Payer: Self-pay | Admitting: Urology

## 2023-08-20 VITALS — BP 110/70 | HR 62

## 2023-08-20 DIAGNOSIS — N401 Enlarged prostate with lower urinary tract symptoms: Secondary | ICD-10-CM

## 2023-08-20 DIAGNOSIS — R339 Retention of urine, unspecified: Secondary | ICD-10-CM

## 2023-08-20 DIAGNOSIS — N400 Enlarged prostate without lower urinary tract symptoms: Secondary | ICD-10-CM

## 2023-08-20 DIAGNOSIS — R972 Elevated prostate specific antigen [PSA]: Secondary | ICD-10-CM | POA: Diagnosis not present

## 2023-08-20 LAB — BLADDER SCAN AMB NON-IMAGING: Scan Result: 32

## 2023-08-20 MED ORDER — LEVOFLOXACIN 750 MG PO TABS
750.0000 mg | ORAL_TABLET | Freq: Every day | ORAL | 0 refills | Status: DC
Start: 2023-08-20 — End: 2023-08-20

## 2023-08-20 MED ORDER — LEVOFLOXACIN 750 MG PO TABS
750.0000 mg | ORAL_TABLET | Freq: Every day | ORAL | 0 refills | Status: AC
Start: 2023-08-20 — End: 2023-08-21

## 2023-08-20 NOTE — Progress Notes (Signed)
History of Present Illness: Here for f/u of BPH w/ h/o retention.  He is doing well on alfuzosin. IPSS 10/2. No untoward SE's.  Recent PSA 8.7. Last value 5.2 in early '23.  Past Medical History:  Diagnosis Date   Anxiety     Past Surgical History:  Procedure Laterality Date   APPENDECTOMY     CHOLECYSTECTOMY     COLONOSCOPY WITH PROPOFOL N/A 02/08/2023   Procedure: COLONOSCOPY WITH PROPOFOL;  Surgeon: Dolores Frame, MD;  Location: [facility];  Service: Gastroenterology;  Laterality: N/A;  10:30am;asa 1-2   POLYPECTOMY  02/08/2023   Procedure: POLYPECTOMY INTESTINAL;  Surgeon: Dolores Frame, MD;  Location: [facility];  Service: Gastroenterology;;    Home Medications:  Allergies as of 08/20/2023   No Known Allergies      Medication List        Accurate as of August 20, 2023 10:50 AM. If you have any questions, ask your nurse or doctor.          alfuzosin 10 MG 24 hr tablet Commonly known as: UROXATRAL Take 1 tablet (10 mg total) by mouth daily with breakfast.   alfuzosin 10 MG 24 hr tablet Commonly known as: UROXATRAL Take 1 tablet (10 mg total) by mouth daily with breakfast.   diazepam 10 MG tablet Commonly known as: VALIUM Take 10 mg by mouth 2 (two) times daily as needed.        Allergies: No Known Allergies  No family history on file.  Social History:  reports that he has never smoked. He has never used smokeless tobacco. He reports current alcohol use of about 1.0 standard drink of alcohol per week. He reports that he does not use drugs.  ROS: A complete review of systems was performed.  All systems are negative except for pertinent findings as noted.  Physical Exam:  Vital signs in last 24 hours: There were no vitals taken for this visit. Constitutional:  Alert and oriented, No acute distress Cardiovascular: Regular rate  Neurologic: Grossly intact, no focal deficits Psychiatric: Normal mood and affect  I have  reviewed prior pt notes  I have reviewed urinalysis results  I have independently reviewed prior imaging  I have reviewed prior PSA results--recently 8.7  Bladder scan reviewed    Impression/Assessment:  BPH w/ LUTS--emptying well, limited sx's  2. Elevated PSA  Plan:  Recommend TRUS/Bx. Risks/complications discussed

## 2023-08-20 NOTE — Patient Instructions (Addendum)
   Appointment ZOXW:[id number] Appointment Date:10/16/2023  Location: [facility]   Prostate Biopsy Instructions  Stop all aspirin or blood thinners (aspirin, plavix, coumadin, warfarin, motrin, ibuprofen, advil, aleve, naproxen, naprosyn) for 7 days prior to the procedure.  If you have any questions about stopping these medications, please contact your primary care physician or cardiologist.  Having a light meal prior to the procedure is recommended.  If you are diabetic or have low blood sugar please bring a small snack or glucose tablet.  A Fleets enema is needed to be purchased over the counter at a local pharmacy and used 2 hours before you scheduled appointment.  This can be purchased over the counter at any pharmacy.  Antibiotics will be administered in the clinic at the time of the procedure and 1 tablet has been sent to your pharmacy. Please take the antibiotic as prescribed.    Please bring someone with you to the procedure to drive you home if you are given a valium to take prior to your procedure.   If you have any questions or concerns, please feel free to call the office at [phone] or send a Mychart message.    Thank you, [facility]

## 2023-08-20 NOTE — Addendum Note (Signed)
Addended by: Grier Rocher on: 08/20/2023 02:34 PM   Modules accepted: Orders

## 2023-10-15 NOTE — Progress Notes (Signed)
Charles Moss is here for TRUS/Bx for elevating PSA trend--most recently 8.7.  Risks, benefits, and some of the potential complications of a transrectal ultrasounds of the prostate (TRUSP) with biopsies were discussed at length with the patient including gross hematuria, blood in the bowel movements, hematospermia, bacteremia, infection, voiding discomfort, urinary retention, fever, chills, sepsis, blood transfusion, death, and others. All questions were answered. Informed consent was obtained. The patient confirmed that he had taken his pre-procedure antibiotic. All anticoagulants were discontinued prior to the procedure. The patient emptied his bladder. He was positioned in a comfortable left lateral decubitus position with hips and knees acutely flexed.  The rectal probe was inserted into the rectum without difficulty. 10cc of 2% Lidocaine without epinephrine was instilled with a spinal needle using ultrasound guidance near the junction of each seminal vesicle and the prostate.  Sequential transverse (axial) scans were made in small increments beginning at the seminal vesicles and ending at the prostatic apex. Sequential longitudinal (saggital) scans were made in small increments beginning at the right lateral prostate and ending at the left lateral prostate. Excellent anatomical imaging was obtained. The peripheral, transitional, and central zones were well-defined. The seminal vesicles were normal.  Prostate volume 125 ml.  There were no hypoechoic areas. 12 needle core biopsies were performed. 1 biopsy each was taken from the following areas:  Right lateral base, right medial base, right lateral mid prostate, right medial mid prostate, right lateral apical prostate, right medial apical prostate, left lateral base, left medial base, left lateral mid prostate, left medial mid prostate, left lateral apical prostate, left medial apical prostate.. Minimal prostatic calcifications were noted. Excellent biopsy  specimens were obtained.  Follow-up rectal examination was unremarkable. The procedure was well-tolerated and without complications. Antibiotic instructions were given. The patient was told that:  For several days:  he should increase his fluid intake and limit strenuous activity  he might have mild discomfort at the base of his penis or in his rectum  he might have blood in his urine or blood in his bowel movements  For 2-3 months:  he might have blood in his ejaculate (semen)  Instructions were given to call the office immedicately for blood clots in the urine or bowel movements, difficulty urinating, inability to urinate, urinary retention, painful or frequent urination, fever, chills, nausea, vomiting, or other illness. The patient stated that he understood these instructions and would comply with them. We told the patient that prostate biopsy pathology reports are usually available within 3-5 working days, unless a pathologic second opinion is required, which may take 7-14 days. We told him to contact us to check on the status of his biopsy if he has not heard from [place] within 7 days. The patient left the ultrasound examination room in stable condition.

## 2023-10-16 ENCOUNTER — Other Ambulatory Visit: Payer: Self-pay | Admitting: Urology

## 2023-10-16 ENCOUNTER — Encounter (HOSPITAL_COMMUNITY): Payer: Self-pay

## 2023-10-16 ENCOUNTER — Ambulatory Visit (HOSPITAL_COMMUNITY)
Admission: RE | Admit: 2023-10-16 | Discharge: 2023-10-16 | Disposition: A | Discharge status: Home / Self Care | Payer: Medicare PPO | Source: Ambulatory Visit | Attending: Urology | Admitting: Urology

## 2023-10-16 ENCOUNTER — Ambulatory Visit (HOSPITAL_BASED_OUTPATIENT_CLINIC_OR_DEPARTMENT_OTHER): Payer: Medicare PPO | Admitting: Urology

## 2023-10-16 DIAGNOSIS — N411 Chronic prostatitis: Secondary | ICD-10-CM

## 2023-10-16 DIAGNOSIS — R972 Elevated prostate specific antigen [PSA]: Secondary | ICD-10-CM | POA: Diagnosis present

## 2023-10-16 DIAGNOSIS — N410 Acute prostatitis: Secondary | ICD-10-CM | POA: Diagnosis not present

## 2023-10-16 MED ORDER — LIDOCAINE HCL (PF) 2 % IJ SOLN
INTRAMUSCULAR | Status: AC
  Filled 2023-10-16: qty 10

## 2023-10-16 MED ORDER — GENTAMICIN SULFATE 40 MG/ML IJ SOLN
INTRAMUSCULAR | Status: AC
  Filled 2023-10-16: qty 4

## 2023-10-16 MED ORDER — LIDOCAINE HCL (PF) 2 % IJ SOLN
10.0000 mL | Freq: Once | INTRAMUSCULAR | Status: AC
Start: 2023-10-16 — End: 2023-10-16
  Administered 2023-10-16: 10 mL

## 2023-10-16 MED ORDER — GENTAMICIN SULFATE 40 MG/ML IJ SOLN
160.0000 mg | Freq: Once | INTRAMUSCULAR | Status: AC
  Administered 2023-10-16: 160 mg via INTRAMUSCULAR

## 2023-10-16 NOTE — Progress Notes (Signed)
PT tolerated prostate biopsy procedure and antibiotic injection well today. Labs obtained and sent for pathology by Richard from ultrasound. PT ambulatory at discharge with no acute distress noted and verbalized understanding of discharge instructions.

## 2023-10-17 LAB — SURGICAL PATHOLOGY

## 2024-07-02 ENCOUNTER — Other Ambulatory Visit: Payer: Self-pay

## 2024-07-02 ENCOUNTER — Telehealth: Payer: Self-pay | Admitting: Urology

## 2024-07-02 DIAGNOSIS — R339 Retention of urine, unspecified: Secondary | ICD-10-CM

## 2024-07-02 MED ORDER — ALFUZOSIN HCL ER 10 MG PO TB24: 10.0000 mg | ORAL_TABLET | Freq: Every day | ORAL | 3 refills | Status: AC

## 2024-07-02 NOTE — Telephone Encounter (Signed)
Refill request sent

## 2024-07-02 NOTE — Telephone Encounter (Signed)
 PHARMACY [facility]  IN [place]    alfuzosin  (UROXATRAL ) 10 MG 24 hr tablet

## 2024-09-03 ENCOUNTER — Encounter (INDEPENDENT_AMBULATORY_CARE_PROVIDER_SITE_OTHER): Payer: Self-pay | Admitting: Gastroenterology

## 2024-11-03 NOTE — Progress Notes (Signed)
" ° ° °  Impression/Assessment:  BPH w/ LUTS--emptying out fairly well well, l my symptoms  2. Elevated PSA--negative biopsy in late 2024 at which time prostate volume was 125 mL  Plan:  1.  For now, stay on the alfuzosin .  If his symptoms get worse before next visit, he will let us  know, and we will consider adding finasteride  2.  I have him come back in 1 year to see Dr. Sherrilee  3.  PSA is checked today   History of Present Illness: Here for f/u of BPH w/ h/o retention.  He is doing well on alfuzosin . IPSS 10/2. No untoward SE's.  Recent PSA 8.7. Last value 5.2 in early '23.  12.17.2024: TRUS/Bx--prostate volume 125 mL.  PSA 8.7, PSAD 0.07.  1/12 cores revealed inflammation, all cores were negative for cancer.  1.6.2025: Still on alfuzosin  for BPH.  At times he has difficulty urinating, but usually not such an issue.  IPSS 19/3    Past Medical History:  Diagnosis Date   Anxiety     Past Surgical History:  Procedure Laterality Date   APPENDECTOMY     CHOLECYSTECTOMY     COLONOSCOPY WITH PROPOFOL  N/A 02/08/2023   Procedure: COLONOSCOPY WITH PROPOFOL ;  Surgeon: Eartha Angelia Sieving, MD;  Location: [facility];  Service: Gastroenterology;  Laterality: N/A;  10:30am;asa 1-2   POLYPECTOMY  02/08/2023   Procedure: POLYPECTOMY INTESTINAL;  Surgeon: Eartha Angelia Sieving, MD;  Location: [facility];  Service: Gastroenterology;;    Home Medications:  Allergies as of 11/04/2024   No Known Allergies      Medication List        Accurate as of November 03, 2024 12:01 PM. If you have any questions, ask your nurse or doctor.          alfuzosin  10 MG 24 hr tablet Commonly known as: UROXATRAL  Take 1 tablet (10 mg total) by mouth daily with breakfast.   alfuzosin  10 MG 24 hr tablet Commonly known as: UROXATRAL  Take 1 tablet (10 mg total) by mouth daily with breakfast.   diazepam 10 MG tablet Commonly known as: VALIUM Take 10 mg by mouth 2 (two) times daily as  needed.        Allergies: No Known Allergies  No family history on file.  Social History:  reports that he has never smoked. He has never used smokeless tobacco. He reports current alcohol use of about 1.0 standard drink of alcohol per week. He reports that he does not use drugs.  ROS: A complete review of systems was performed.  All systems are negative except for pertinent findings as noted.  Physical Exam:  Vital signs in last 24 hours: There were no vitals taken for this visit. Constitutional:  Alert and oriented, No acute distress Cardiovascular: Regular rate  Neurologic: Grossly intact, no focal deficits Psychiatric: Normal mood and affect  I have reviewed prior pt notes  I have reviewed urinalysis results  I have independently reviewed prior imaging  I have reviewed prior PSA results--most recently 8.7  Bladder scan reviewed-residual 40 mL  IPSS sheet reviewed    "

## 2024-11-04 ENCOUNTER — Ambulatory Visit: Payer: Medicare PPO | Admitting: Urology

## 2024-11-04 VITALS — BP 103/63 | HR 65

## 2024-11-04 DIAGNOSIS — N401 Enlarged prostate with lower urinary tract symptoms: Secondary | ICD-10-CM | POA: Diagnosis not present

## 2024-11-04 DIAGNOSIS — R972 Elevated prostate specific antigen [PSA]: Secondary | ICD-10-CM

## 2024-11-04 DIAGNOSIS — R339 Retention of urine, unspecified: Secondary | ICD-10-CM

## 2024-11-04 DIAGNOSIS — N400 Enlarged prostate without lower urinary tract symptoms: Secondary | ICD-10-CM

## 2024-11-04 LAB — URINALYSIS, ROUTINE W REFLEX MICROSCOPIC
Bilirubin, UA: NEGATIVE
Glucose, UA: NEGATIVE
Ketones, UA: NEGATIVE
Leukocytes,UA: NEGATIVE
Nitrite, UA: NEGATIVE
Protein,UA: NEGATIVE
Specific Gravity, UA: 1.025 (ref 1.005–1.030)
Urobilinogen, Ur: 0.2 mg/dL (ref 0.2–1.0)
pH, UA: 5.5 (ref 5.0–7.5)

## 2024-11-04 LAB — MICROSCOPIC EXAMINATION: Bacteria, UA: NONE SEEN

## 2024-11-04 LAB — BLADDER SCAN AMB NON-IMAGING: Scan Result: 40

## 2024-11-04 NOTE — Progress Notes (Signed)
 Bladder Scan completed today due to reason of BPH  Patient can void prior to the bladder scan. Bladder scan result: 40  Performed By: Exie DASEN. CMA  Additional notes- Patient is scheduled to follow up with MD

## 2024-11-05 ENCOUNTER — Ambulatory Visit: Payer: Self-pay

## 2024-11-05 LAB — PSA: Prostate Specific Ag, Serum: 4.9 ng/mL — ABNORMAL HIGH (ref 0.0–4.0)

## 2024-11-05 NOTE — Telephone Encounter (Signed)
-----   Message from Garnette Shack, MD sent at 11/05/2024 11:56 AM EST ----- Good news--PSA 4.9

## 2024-11-05 NOTE — Telephone Encounter (Signed)
 Called patient to give results per Md Dahlstedt patient did not answer and I was unable to leave voicemail

## 2025-03-30 ENCOUNTER — Ambulatory Visit: Admitting: Urology
